# Patient Record
Sex: Male | Born: 1995 | Race: White | Hispanic: No | Marital: Single | Smoking: Never smoker
Health system: Southern US, Community
[De-identification: ages and names within clinical notes are randomized; demographics above are authoritative.]

## PROBLEM LIST (undated history)

## (undated) DIAGNOSIS — F909 Attention-deficit hyperactivity disorder, unspecified type: Secondary | ICD-10-CM

## (undated) HISTORY — DX: Attention-deficit hyperactivity disorder, unspecified type: F90.9

## (undated) HISTORY — PX: TESTICLE REMOVAL: SHX68

---
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
## 2013-01-23 ENCOUNTER — Emergency Department: Payer: Self-pay | Admitting: Emergency Medicine

## 2013-10-01 ENCOUNTER — Emergency Department: Payer: Self-pay | Admitting: Internal Medicine

## 2013-10-23 ENCOUNTER — Emergency Department: Payer: Self-pay | Admitting: Emergency Medicine

## 2014-03-12 ENCOUNTER — Emergency Department: Payer: Self-pay | Admitting: Emergency Medicine

## 2014-03-12 LAB — TROPONIN I: Troponin-I: <0.02 ng/mL

## 2014-03-12 LAB — COMPREHENSIVE METABOLIC PANEL
Albumin: 4.5 g/dL (ref 3.4–5.0)
Alkaline Phosphatase: 105 U/L (ref 46–116)
Anion Gap: 8 (ref 7–16)
BUN: 15 mg/dL (ref 7–18)
Bilirubin,Total: 1.5 mg/dL — ABNORMAL HIGH (ref 0.2–1.0)
Calcium, Total: 9.3 mg/dL (ref 8.5–10.1)
Chloride: 102 mmol/L (ref 98–107)
Co2: 27 mmol/L (ref 21–32)
Creatinine: 0.99 mg/dL (ref 0.60–1.30)
EGFR (African American): >60
EGFR (Non-African Amer.): >60
Glucose: 98 mg/dL (ref 65–99)
Osmolality: 275 (ref 275–301)
Potassium: 4.5 mmol/L (ref 3.5–5.1)
SGOT(AST): 44 U/L — ABNORMAL HIGH (ref 15–37)
SGPT (ALT): 22 U/L (ref 14–63)
Sodium: 137 mmol/L (ref 136–145)
Total Protein: 8.8 g/dL — ABNORMAL HIGH (ref 6.4–8.2)

## 2014-03-12 LAB — CBC WITH DIFFERENTIAL/PLATELET
Basophil #: 0 10*3/uL (ref 0.0–0.1)
Basophil %: 0.1 %
EOS PCT: 0.9 %
Eosinophil #: 0.1 10*3/uL (ref 0.0–0.7)
HCT: 51.1 % (ref 40.0–52.0)
HGB: 16.7 g/dL (ref 13.0–18.0)
Lymphocyte #: 0.9 10*3/uL — ABNORMAL LOW (ref 1.0–3.6)
Lymphocyte %: 6.9 %
MCH: 29.7 pg (ref 26.0–34.0)
MCHC: 32.7 g/dL (ref 32.0–36.0)
MCV: 91 fL (ref 80–100)
MONO ABS: 0.7 x10 3/mm (ref 0.2–1.0)
Monocyte %: 5.4 %
Neutrophil #: 11 10*3/uL — ABNORMAL HIGH (ref 1.4–6.5)
Neutrophil %: 86.7 %
Platelet: 197 10*3/uL (ref 150–440)
RBC: 5.62 10*6/uL (ref 4.40–5.90)
RDW: 13.3 % (ref 11.5–14.5)
WBC: 12.7 10*3/uL — AB (ref 3.8–10.6)

## 2014-03-12 LAB — LIPASE, BLOOD: Lipase: 111 U/L (ref 73–393)

## 2014-09-09 ENCOUNTER — Encounter: Payer: Self-pay | Admitting: Emergency Medicine

## 2014-09-09 DIAGNOSIS — M545 Low back pain: Secondary | ICD-10-CM | POA: Insufficient documentation

## 2014-09-09 NOTE — ED Notes (Signed)
Pt presents to ER alert and in NAD. Pt states he has been having back pain since sleeping on his couch.

## 2014-09-10 ENCOUNTER — Emergency Department
Admission: EM | Admit: 2014-09-10 | Discharge: 2014-09-10 | Disposition: A | Discharge status: Home / Self Care | Payer: Self-pay | Attending: Emergency Medicine | Admitting: Emergency Medicine

## 2014-09-10 DIAGNOSIS — M545 Low back pain, unspecified: Secondary | ICD-10-CM

## 2014-09-10 MED ORDER — CYCLOBENZAPRINE HCL 5 MG PO TABS
5.0000 mg | ORAL_TABLET | Freq: Three times a day (TID) | ORAL | Status: DC | PRN
Start: 2014-09-10 — End: 2015-01-28

## 2014-09-10 MED ORDER — CYCLOBENZAPRINE HCL 10 MG PO TABS
5.0000 mg | ORAL_TABLET | Freq: Once | ORAL | Status: AC
  Administered 2014-09-10: 5 mg via ORAL
  Filled 2014-09-10: qty 1

## 2014-09-10 NOTE — ED Provider Notes (Signed)
[facility] Emergency Department Provider Note  ____________________________________________  Time seen: 0115  I have reviewed the triage vital signs and the nursing notes.   HISTORY  Chief Complaint Back Pain   History limited by: Not Limited   HPI Darren Quinn is a 19 y.o. male who presents to the emergency department today because of back pain. He states that the pain is located in his lower mid back and lower back. It is located on the sides. It is been going on for 1 week. It has been constant. He denies any lower extremity numbness or tingling. He denies any urinary retention. He denies any bowel incontinence. Denies any fevers. Denies any IV drug use. Denies any trauma to his back.     History reviewed. No pertinent past medical history.  There are no active problems to display for this patient.   Past Surgical History  Procedure Laterality Date  . Testicle removal      No current outpatient prescriptions on file.  Allergies Hydrocodone  History reviewed. No pertinent family history.  Social History History  Substance Use Topics  . Smoking status: Never Smoker   . Smokeless tobacco: Not on file  . Alcohol Use: No    Review of Systems  Constitutional: Negative for fever. Cardiovascular: Negative for chest pain. Respiratory: Negative for shortness of breath. Gastrointestinal: Negative for abdominal pain, vomiting and diarrhea. Genitourinary: Negative for dysuria. Musculoskeletal: Positive for back pain. Skin: Negative for rash. Neurological: Negative for headaches, focal weakness or numbness.   10-point ROS otherwise negative.  ____________________________________________   PHYSICAL EXAM:  VITAL SIGNS: ED Triage Vitals  Enc Vitals Group     BP 09/09/14 2221 134/72 mmHg     Pulse Rate 09/09/14 2221 70     Resp 09/09/14 2221 20     Temp 09/09/14 2221 98.3 F (36.8 C)     Temp Source 09/09/14 2221 Oral   SpO2 09/09/14 2221 100 %     Weight 09/09/14 2221 130 lb (58.968 kg)     Height 09/09/14 2221 5\' 10"  (1.778 m)     Head Cir --      Peak Flow --      Pain Score 09/09/14 2221 9   Constitutional: Alert and oriented. Sleeping on my arrival to the exam room. Well appearing and in no distress. Eyes: Conjunctivae are normal. PERRL. Normal extraocular movements. ENT   Head: Normocephalic and atraumatic.   Nose: No congestion/rhinnorhea.   Mouth/Throat: Mucous membranes are moist.   Neck: No stridor. Hematological/Lymphatic/Immunilogical: No cervical lymphadenopathy. Cardiovascular: Normal rate, regular rhythm.  No murmurs, rubs, or gallops. Respiratory: Normal respiratory effort without tachypnea nor retractions. Breath sounds are clear and equal bilaterally. No wheezes/rales/rhonchi. Gastrointestinal: Soft and nontender. No distention.  Genitourinary: Deferred Musculoskeletal: Normal range of motion in all extremities. No joint effusions.  No lower extremity tenderness nor edema. No midline tenderness back. Neurologic:  Normal speech and language. No gross focal neurologic deficits are appreciated. Speech is normal.  Skin:  Skin is warm, dry and intact. No rash noted. Psychiatric: Mood and affect are normal. Speech and behavior are normal. Patient exhibits appropriate insight and judgment.  ____________________________________________    LABS (pertinent positives/negatives)  None  ____________________________________________   EKG  None  ____________________________________________    RADIOLOGY  None  ____________________________________________   PROCEDURES  Procedure(s) performed: None  Critical Care performed: No  ____________________________________________   INITIAL IMPRESSION / ASSESSMENT AND PLAN / ED COURSE  Pertinent labs &  imaging results that were available during my care of the patient were reviewed by me and considered in my medical  decision making (see chart for details).  Patient presents to the emergency department today with concerns for back pain. Patient does not have any red flags. On exam patient is well-appearing. Will give prescription for Flexeril. Discussed return cautions with patient.  ____________________________________________   FINAL CLINICAL IMPRESSION(S) / ED DIAGNOSES  Final diagnoses:  Bilateral low back pain without sciatica     Phineas Semen, MD 09/10/14 0131

## 2014-09-10 NOTE — Discharge Instructions (Signed)
Please seek medical attention for any high fevers, chest pain, shortness of breath, change in behavior, persistent vomiting, bloody stool or any other new or concerning symptoms. ° ° °Back Exercises °Back exercises help treat and prevent back injuries. The goal of back exercises is to increase the strength of your abdominal and back muscles and the flexibility of your back. These exercises should be started when you no longer have back pain. Back exercises include: °· Pelvic Tilt. Lie on your back with your knees bent. Tilt your pelvis until the lower part of your back is against the floor. Hold this position 5 to 10 sec and repeat 5 to 10 times. °· Knee to Chest. Pull first 1 knee up against your chest and hold for 20 to 30 seconds, repeat this with the other knee, and then both knees. This may be done with the other leg straight or bent, whichever feels better. °· Sit-Ups or Curl-Ups. Bend your knees 90 degrees. Start with tilting your pelvis, and do a partial, slow sit-up, lifting your trunk only 30 to 45 degrees off the floor. Take at least 2 to 3 seconds for each sit-up. Do not do sit-ups with your knees out straight. If partial sit-ups are difficult, simply do the above but with only tightening your abdominal muscles and holding it as directed. °· Hip-Lift. Lie on your back with your knees flexed 90 degrees. Push down with your feet and shoulders as you raise your hips a couple inches off the floor; hold for 10 seconds, repeat 5 to 10 times. °· Back arches. Lie on your stomach, propping yourself up on bent elbows. Slowly press on your hands, causing an arch in your low back. Repeat 3 to 5 times. Any initial stiffness and discomfort should lessen with repetition over time. °· Shoulder-Lifts. Lie face down with arms beside your body. Keep hips and torso pressed to floor as you slowly lift your head and shoulders off the floor. °Do not overdo your exercises, especially in the beginning. Exercises may cause you  some mild back discomfort which lasts for a few minutes; however, if the pain is more severe, or lasts for more than 15 minutes, do not continue exercises until you see your caregiver. Improvement with exercise therapy for back problems is slow.  °See your caregivers for assistance with developing a proper back exercise program. °Document Released: 03/06/2004 Document Revised: 04/21/2011 Document Reviewed: 11/28/2010 °ExitCare® Patient Information ©2015 [employer]. This information is not intended to replace advice given to you by your health care provider. Make sure you discuss any questions you have with your health care provider. ° °

## 2014-09-10 NOTE — ED Notes (Signed)
Pt. Going home with friend 

## 2015-01-28 ENCOUNTER — Encounter: Payer: Self-pay | Admitting: Gynecology

## 2015-01-28 ENCOUNTER — Ambulatory Visit
Admission: EM | Admit: 2015-01-28 | Discharge: 2015-01-28 | Disposition: A | Discharge status: Home / Self Care | Payer: Self-pay | Attending: Family Medicine | Admitting: Family Medicine

## 2015-01-28 DIAGNOSIS — M546 Pain in thoracic spine: Secondary | ICD-10-CM

## 2015-01-28 MED ORDER — NAPROXEN 500 MG PO TABS: 500.0000 mg | ORAL_TABLET | Freq: Two times a day (BID) | ORAL | Status: DC

## 2015-01-28 MED ORDER — CYCLOBENZAPRINE HCL 5 MG PO TABS: 5.0000 mg | ORAL_TABLET | Freq: Two times a day (BID) | ORAL | Status: DC | PRN

## 2015-01-28 NOTE — ED Provider Notes (Addendum)
Patient presents today with symptoms of mid back pain since yesterday. 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 He denies any radicular symptoms. He denies any acute trauma to the area or fall. He denies any fever, incontinence, weight loss.  ROS: Negative except mentioned above. Vitals as per Epic GENERAL: NAD RESP: CTA B CARD: RRR MSK: no midline tenderness, mild right generalized thoracic paravertebral tenderness, FROM, mild discomfort with flexion, -SLR, nv intact  NEURO: CN II-XII grossly intact   A/P: Right mid thoracic back strain/spasm- will treat with Naprosyn and Flexeril at this time when necessary, ice/heat when necessary, no strenuous activity for the next 2 days at work, excuse written for this, if symptoms do persist or worsen I do recommend imaging and follow-up with primary care physician.   Jolene ProvostKirtida Marshawn Ninneman, MD 01/28/15 1558  Jolene ProvostKirtida Rennie Rouch, MD 01/28/15 1600

## 2015-01-28 NOTE — ED Notes (Signed)
Patient c/o lower back pain x yesterday. Per patient work pulling tires.

## 2015-12-08 ENCOUNTER — Ambulatory Visit
Admission: EM | Admit: 2015-12-08 | Discharge: 2015-12-08 | Disposition: A | Discharge status: Home / Self Care | Payer: Self-pay | Attending: Family Medicine | Admitting: Family Medicine

## 2015-12-08 DIAGNOSIS — S0501XA Injury of conjunctiva and corneal abrasion without foreign body, right eye, initial encounter: Secondary | ICD-10-CM

## 2015-12-08 MED ORDER — GENTAMICIN SULFATE 0.3 % OP SOLN: 2.0000 [drp] | Freq: Three times a day (TID) | OPHTHALMIC | 0 refills | Status: DC

## 2015-12-08 MED ORDER — HYDROCODONE-ACETAMINOPHEN 5-325 MG PO TABS: 1.0000 | ORAL_TABLET | Freq: Three times a day (TID) | ORAL | 0 refills | Status: DC | PRN

## 2015-12-08 NOTE — ED Provider Notes (Signed)
[facility]    CSN: [id number] Arrival date & time: 12/08/15  1201     History   Chief Complaint Chief Complaint  Patient presents with  . Eye Pain    Right side at work scratch the eye irritated painful blurry onset yesterday pt doesn't use lence or glasses    HPI Darren Quinn is a 20 y.o. male.   Patient is a 20 year old white male who comes in because of irritation of his right eye. He states that he feels that something got in his right eye yesterday. He was doing some sawing at home. Since she's had this happen to him one time before as well. No known drug allergies pain medication makes him loopy as he puts it. Habits he does not smoke but he does chew. Nodisc and family medical history pertinent to today's visit. No chronic medications no previous surgeries or operations.   The history is provided by the patient. No language interpreter was used.  Eye Pain  This is a new problem. The current episode started yesterday. The problem occurs constantly. The problem has been gradually worsening. Pertinent negatives include no chest pain, no abdominal pain and no headaches. Exacerbated by: Blinking his eyes and making things worse. Nothing relieves the symptoms. Treatments tried: Irrigated his eyes at home without much improvement. The treatment provided mild relief.    History reviewed. No pertinent past medical history.  There are no active problems to display for this patient.   Past Surgical History:  Procedure Laterality Date  . TESTICLE REMOVAL         Home Medications    Prior to Admission medications   Medication Sig Start Date End Date Taking? Authorizing Provider  cyclobenzaprine (FLEXERIL) 5 MG tablet Take 1 tablet (5 mg total) by mouth 2 (two) times daily as needed for muscle spasms (can cause drowsiness). 01/28/15   Jolene ProvostKirtida Patel, MD  gentamicin (GARAMYCIN) 0.3 % ophthalmic solution Place 2 drops into the right eye 3 (three) times daily.  For the next three to five days 12/08/15   Hassan RowanEugene Vienna Folden, MD  HYDROcodone-acetaminophen [facility](NORCO) 5-325 MG tablet Take 1 tablet by mouth every 8 (eight) hours as needed for moderate pain. 12/08/15   Hassan RowanEugene Sheldon Sem, MD  naproxen (NAPROSYN) 500 MG tablet Take 1 tablet (500 mg total) by mouth 2 (two) times daily. Take with food. 01/28/15   Jolene ProvostKirtida Patel, MD    Family History No family history on file.  Social History Social History  Substance Use Topics  . Smoking status: Never Smoker  . Smokeless tobacco: Never Used  . Alcohol use No     Allergies   Hydrocodone   Review of Systems Review of Systems  Eyes: Positive for photophobia, pain and redness.  Cardiovascular: Negative for chest pain.  Gastrointestinal: Negative for abdominal pain.  Neurological: Negative for headaches.  All other systems reviewed and are negative.    Physical Exam Triage Vital Signs ED Triage Vitals  Enc Vitals Group     BP 12/08/15 1318 (!) 131/52     Pulse Rate 12/08/15 1318 63     Resp 12/08/15 1318 16     Temp 12/08/15 1318 97 F (36.1 C)     Temp Source 12/08/15 1318 Oral     SpO2 12/08/15 1318 100 %     Weight 12/08/15 1321 133 lb (60.3 kg)     Height 12/08/15 1321 5\' 11"  (1.803 m)     Head Circumference --  Peak Flow --      Pain Score 12/08/15 1323 5     Pain Loc --      Pain Edu? --      Excl. in GC? --    No data found.   Updated Vital Signs BP (!) 131/52 (BP Location: Left Arm)   Pulse 63   Temp 97 F (36.1 C) (Oral)   Resp 16   Ht 5\' 11"  (1.803 m)   Wt 133 lb (60.3 kg)   SpO2 100%   BMI 18.55 kg/m   Visual Acuity Right Eye Distance: 20/40 Left Eye Distance: 20/30 Bilateral Distance: 20/20  Right Eye Near:   Left Eye Near:    Bilateral Near:     Physical Exam  Constitutional: He appears well-developed and well-nourished.  HENT:  Head: Normocephalic and atraumatic.  Right Ear: External ear normal.  Left Ear: External ear normal.  Eyes: EOM are normal. Pupils  are equal, round, and reactive to light. Foreign body present in the right eye. Right conjunctiva is injected. Left conjunctiva is not injected.  Small foreign objects removed off the lid's that was slightly medial. Corneal abrasion present at about 5 and 7:00 as well on the cornea  Vitals reviewed.    UC Treatments / Results  Labs (all labs ordered are listed, but only abnormal results are displayed) Labs Reviewed - No data to display  EKG  EKG Interpretation None       Radiology No results found.  Procedures .Foreign Body Removal Date/Time: 12/08/2015 2:25 PM Performed by: Hassan RowanWADE, Laden Fieldhouse Authorized by: Hassan RowanWADE, Candace Begue  Consent: Verbal consent obtained. Body area: eye Location details: right eyelid Anesthesia: local infiltration  Anesthesia: Local Anesthetic: tetracaine drops  Sedation: Patient sedated: no Localization method: eyelid eversion and magnification Removal mechanism: moist cotton swab Eye examined with fluorescein. Corneal abrasion size: medium Corneal abrasion location: lateral and medial No residual rust ring present. Depth: superficial Complexity: simple Post-procedure assessment: foreign body removed Patient tolerance: Patient tolerated the procedure well with no immediate complications Comments: Patient reports improvement of the symptoms after the small object was removed from his arm.   (including critical care time)  Medications Ordered in UC Medications - No data to display   Initial Impression / Assessment and Plan / UC Course  I have reviewed the triage vital signs and the nursing notes.  Pertinent labs & imaging results that were available during my care of the patient were reviewed by me and considered in my medical decision making (see chart for details).  Clinical Course     Final Clinical Impressions(s) / UC Diagnoses   Final diagnoses:  Abrasion of right cornea, initial encounter    New Prescriptions New Prescriptions    GENTAMICIN (GARAMYCIN) 0.3 % OPHTHALMIC SOLUTION    Place 2 drops into the right eye 3 (three) times daily. For the next three to five days   HYDROCODONE-ACETAMINOPHEN (NORCO) 5-325 MG TABLET    Take 1 tablet by mouth every 8 (eight) hours as needed for moderate pain.     Hassan RowanEugene Ladora Osterberg, MD 12/08/15 [phone]

## 2015-12-09 ENCOUNTER — Telehealth: Payer: Self-pay

## 2015-12-09 NOTE — Telephone Encounter (Signed)
Spoke with patient. Patient advised to only take half of pain medication since he feels like it may be too strong for him. Patient advised to call back with any other problems or concerns. [facility]

## 2016-06-19 ENCOUNTER — Emergency Department
Admission: EM | Admit: 2016-06-19 | Discharge: 2016-06-19 | Disposition: A | Discharge status: Home / Self Care | Payer: Self-pay | Attending: Emergency Medicine | Admitting: Emergency Medicine

## 2016-06-19 DIAGNOSIS — Z79899 Other long term (current) drug therapy: Secondary | ICD-10-CM | POA: Insufficient documentation

## 2016-06-19 DIAGNOSIS — K047 Periapical abscess without sinus: Secondary | ICD-10-CM | POA: Insufficient documentation

## 2016-06-19 MED ORDER — AMOXICILLIN 500 MG PO CAPS: 1000.0000 mg | ORAL_CAPSULE | Freq: Once | ORAL | Status: DC

## 2016-06-19 MED ORDER — AMOXICILLIN 875 MG PO TABS: 875.0000 mg | ORAL_TABLET | Freq: Two times a day (BID) | ORAL | 0 refills | Status: AC

## 2016-06-19 MED ORDER — LIDOCAINE-EPINEPHRINE 2 %-1:100000 IJ SOLN: 1.7000 mL | Freq: Once | INTRAMUSCULAR | Status: DC

## 2016-06-19 MED ORDER — MAGIC MOUTHWASH W/LIDOCAINE: 5.0000 mL | Freq: Four times a day (QID) | ORAL | 0 refills | Status: DC

## 2016-06-19 MED ORDER — LIDOCAINE-EPINEPHRINE 2 %-1:100000 IJ SOLN
INTRAMUSCULAR | Status: AC
  Filled 2016-06-19: qty 1.7

## 2016-06-19 NOTE — Discharge Instructions (Signed)
OPTIONS FOR DENTAL FOLLOW UP CARE ° °[place] Department of Health and Human Services - Local Safety Net Dental Clinics °http://www.ncdhhs.gov/dph/oralhealth/services/safetynetclinics.htm °  °[facility] (336-562-3123) ° °[facility] [place] (919-933-9087) ° °[facility] ([phone] ext [phone]) ° °[facility] ([phone]) ° °[facility] ([phone]) °This clinic caters to the indigent population and is on a lottery system. °Location: °[facility], [address], [place] °Clinic Hours: °Wednesdays from 6pm - 9pm, patients seen by a lottery system. °For dates, call or go to [email or web address].edu/[facility]/patients/Dental-[facility] °Services: °Cleanings, fillings and simple extractions. °Payment Options: °DENTAL WORK IS FREE OF CHARGE. Bring proof of income or support. °Best way to get seen: °Arrive at 5:15 pm - this is a lottery, NOT first come/first serve, so arriving earlier will not increase your chances of being seen. °  °  °[facility] °[phone] °Select option 1 for emergencies °  °Location: °[facility], [address], [place] °Clinic Hours: °No walk-ins accepted - call the day before to schedule an appointment. °Check in times are 9:30 am and 1:30 pm. °Services: °Simple extractions, temporary fillings, pulpectomy/pulp debridement, uncomplicated abscess drainage. °Payment Options: °PAYMENT IS DUE AT THE TIME OF SERVICE.  Fee is usually $100-200, additional surgical procedures (e.g. abscess drainage) may be extra. °Cash, checks, Visa/MasterCard accepted.  Can file Medicaid if patient is covered for dental - patient should call case worker to check. °No discount for [facility] patients. °Best way to get seen: °MUST call the day before and get onto the schedule. Can usually be seen the next 1-2 days. No walk-ins accepted. °  °  °[facility] °[phone] °   °Location: °[facility], [address], [place] °Clinic Hours: °M, W, Th, F 8am or 1:30pm, Tues 9a or 1:30 - first come/first served. °Services: °Simple extractions, temporary fillings, uncomplicated abscess drainage.  You do not need to be an [place] resident. °Payment Options: °PAYMENT IS DUE AT THE TIME OF SERVICE. °Dental insurance, otherwise sliding scale - bring proof of income or support. °Depending on income and treatment needed, cost is usually $50-200. °Best way to get seen: °Arrive early as it is first come/first served. °  °  °[facility] °[phone] °  °Location: °[address] °Clinic Hours: °Mon-Thu 8a-5p °Services: °Most basic dental services including extractions and fillings. °Payment Options: °PAYMENT IS DUE AT THE TIME OF SERVICE. °Sliding scale, up to 50% off - bring proof if income or support. °Medicaid with dental option accepted. °Best way to get seen: °Call to schedule an appointment, can usually be seen within 2 weeks OR they will try to see walk-ins - show up at 8a or 2p (you may have to wait). °  °  °[facility] °919-245-2435 °[place] RESIDENTS ONLY °  °Location: °[facility], [address], [place], [place] [address] °Clinic Hours: By appointment only. °Monday - Thursday 8am-5pm, Friday 8am-12pm °Services: Cleanings, fillings, extractions. °Payment Options: °PAYMENT IS DUE AT THE TIME OF SERVICE. °Cash, Visa or MasterCard. Sliding scale - $30 minimum per service. °Best way to get seen: °Come in to office, complete packet and make an appointment - need proof of income °or support monies for each household member and proof of [place] residence. °Usually takes about a month to get in. °  °  °[facility] °[phone] °  °Location: °[address]   [place] °Clinic Hours: Walk-in Urgent Care Dental Services are offered Monday-Friday  mornings only. °The numbers of emergencies accepted daily is limited to the number of °providers available. °Maximum 15 - Mondays, Wednesdays & Thursdays °Maximum 10 - Tuesdays & Fridays °Services: °You do not need to be a [place] resident to be seen for a dental emergency. °Emergencies are defined as pain, swelling, abnormal bleeding, or dental trauma. Walkins will receive x-rays if needed. °NOTE: Dental cleaning is not an emergency. °Payment Options: °PAYMENT IS DUE AT THE TIME OF SERVICE. °Minimum co-pay is $40.00 for uninsured patients. °Minimum co-pay is $3.00 for Medicaid with dental coverage. °Dental Insurance is accepted and must be presented at time of visit. °Medicare does not cover dental. °Forms of payment: Cash, credit card, checks. °Best way to get seen: °If not previously registered with the clinic, walk-in dental registration begins at 7:15 am and is on a first come/first serve basis. °If previously registered with the clinic, call to make an appointment. °  °  °[facility] °[phone] °[place] RESIDENTS ONLY °  °Location: °[address], [place], [place] °Clinic Hours: °Mon-Thu 10a-2p °Services: Extractions only! °Payment Options: °FREE (donations accepted) - bring proof of income or support °Best way to get seen: °Call and schedule an appointment OR come at 8am on the 1st Monday of every month (except for holidays) when it is first come/first served. °  °  °[facility] °[phone] °  °Location: °[address], [place] °Clinic Hours: °Friday mornings °Services, Payment Options, Best way to get seen: °Call for info °

## 2016-06-19 NOTE — ED Triage Notes (Signed)
Pt in with co toothache for weeks states now feels like right side of face is swelling. Minimal swelling noted, states needs to see a dentist.

## 2016-06-19 NOTE — ED Provider Notes (Signed)
[facility] Emergency Department Provider Note  ____________________________________________  Time seen: Approximately 11:02 PM  I have reviewed the triage vital signs and the nursing notes.   HISTORY  Chief Complaint Dental Pain    HPI Darren Quinn is a 21 y.o. male who presents emergency department complaining of right upper dental pain/infection. Patient reports that he has a cavity with significant dental erosion to the second molar right upper dentition. Patient reports pain began yesterday and swelling began today. He denies fevers or chills, he denies any drainage, he denies any difficulty breathing or swallowing. Patient reports he does have a dentist, but it is a 250.co-pay for dental extraction. He does not have the amount of money at this time and is unable to see his dentist. No other complaints.   No past medical history on file.  There are no active problems to display for this patient.   Past Surgical History:  Procedure Laterality Date  . TESTICLE REMOVAL      Prior to Admission medications   Medication Sig Start Date End Date Taking? Authorizing Provider  amoxicillin (AMOXIL) 875 MG tablet Take 1 tablet (875 mg total) by mouth 2 (two) times daily. 06/19/16   Yue Glasheen, Delorise Royals, PA-C  cyclobenzaprine (FLEXERIL) 5 MG tablet Take 1 tablet (5 mg total) by mouth 2 (two) times daily as needed for muscle spasms (can cause drowsiness). 01/28/15   Jolene Provost, MD  gentamicin (GARAMYCIN) 0.3 % ophthalmic solution Place 2 drops into the right eye 3 (three) times daily. For the next three to five days 12/08/15   Hassan Rowan, MD  HYDROcodone-acetaminophen [facility]) 5-325 MG tablet Take 1 tablet by mouth every 8 (eight) hours as needed for moderate pain. 12/08/15   Hassan Rowan, MD  magic mouthwash w/lidocaine SOLN Take 5 mLs by mouth 4 (four) times daily. 06/19/16   Kavon Valenza, Delorise Royals, PA-C  naproxen (NAPROSYN) 500 MG tablet Take 1 tablet  (500 mg total) by mouth 2 (two) times daily. Take with food. 01/28/15   Jolene Provost, MD    Allergies Hydrocodone  No family history on file.  Social History Social History  Substance Use Topics  . Smoking status: Never Smoker  . Smokeless tobacco: Never Used  . Alcohol use No     Review of Systems  Constitutional: No fever/chills Eyes: No visual changes. No discharge ENT: Positive for left upper dental infection Cardiovascular: no chest pain. Respiratory: no cough. No SOB. Gastrointestinal: No abdominal pain.  No nausea, no vomiting.  Musculoskeletal: Negative for musculoskeletal pain. Skin: Negative for rash, abrasions, lacerations, ecchymosis. Neurological: Negative for headaches, focal weakness or numbness. 10-point ROS otherwise negative.  ____________________________________________   PHYSICAL EXAM:  VITAL SIGNS: ED Triage Vitals [06/19/16 2222]  Enc Vitals Group     BP 134/86     Pulse Rate (!) 51     Resp 18     Temp 98.1 F (36.7 C)     Temp Source Oral     SpO2 100 %     Weight 128 lb (58.1 kg)     Height 5\' 11"  (1.803 m)     Head Circumference      Peak Flow      Pain Score 10     Pain Loc      Pain Edu?      Excl. in GC?      Constitutional: Alert and oriented. Well appearing and in no acute distress. Eyes: Conjunctivae are normal. PERRL. EOMI. Head:  Atraumatic. ENT:      Ears:       Nose: No congestion/rhinnorhea.      Mouth/Throat: Mucous membranes are moist. Dental caries/erosion to tooth #2. Surrounding erythema and edema consistent with dental infection. No fluctuant lesion with palpation with tongue depressor. No pustular drainage noted from tooth. No loose dentition. Neck: No stridor.   Hematological/Lymphatic/Immunilogical: No cervical lymphadenopathy. Cardiovascular: Normal rate, regular rhythm. Normal S1 and S2.  Good peripheral circulation. Respiratory: Normal respiratory effort without tachypnea or retractions. Lungs CTAB.  Good air entry to the bases with no decreased or absent breath sounds. Musculoskeletal: Full range of motion to all extremities. No gross deformities appreciated. Neurologic:  Normal speech and language. No gross focal neurologic deficits are appreciated.  Skin:  Skin is warm, dry and intact. No rash noted. Psychiatric: Mood and affect are normal. Speech and behavior are normal. Patient exhibits appropriate insight and judgement.   ____________________________________________   LABS (all labs ordered are listed, but only abnormal results are displayed)  Labs Reviewed - No data to display ____________________________________________  EKG   ____________________________________________  RADIOLOGY   No results found.  ____________________________________________    PROCEDURES  Procedure(s) performed:    Procedures  Dental block is performed using a 27-gauge needle on the dental Carpuject 1.7 mL lidocaine with epi. This is injected into the inferior alveolar nerve. Patient tolerated well with no complications.  Medications  amoxicillin (AMOXIL) capsule 1,000 mg (not administered)  lidocaine-EPINEPHrine (XYLOCAINE W/EPI) 2 %-1:100000 (with pres) injection 1.7 mL (not administered)     ____________________________________________   INITIAL IMPRESSION / ASSESSMENT AND PLAN / ED COURSE  Pertinent labs & imaging results that were available during my care of the patient were reviewed by me and considered in my medical decision making (see chart for details).  Review of the [place] CSRS was performed in accordance of the [facility] prior to dispensing any controlled drugs.     Patient's diagnosis is consistent with dental infection. Patient is given first dose of antibiotics in the emergency department. Dental block is performed as described above. Patient will follow-up with dentist.. Patient is given ED precautions to return to the ED for any worsening or new  symptoms.     ____________________________________________  FINAL CLINICAL IMPRESSION(S) / ED DIAGNOSES  Final diagnoses:  Dental infection      NEW MEDICATIONS STARTED DURING THIS VISIT:  New Prescriptions   AMOXICILLIN (AMOXIL) 875 MG TABLET    Take 1 tablet (875 mg total) by mouth 2 (two) times daily.   MAGIC MOUTHWASH W/LIDOCAINE SOLN    Take 5 mLs by mouth 4 (four) times daily.        This chart was dictated using voice recognition software/Dragon. Despite best efforts to proofread, errors can occur which can change the meaning. Any change was purely unintentional.    Racheal PatchesCuthriell, Wang Gran[place] D, PA-C 06/19/16 2328    Phineas SemenGoodman, Graydon, MD 06/19/16 [phone]

## 2016-06-19 NOTE — ED Notes (Signed)

## 2018-03-22 ENCOUNTER — Encounter (HOSPITAL_COMMUNITY): Payer: Self-pay | Admitting: *Deleted

## 2018-03-22 ENCOUNTER — Other Ambulatory Visit: Payer: Self-pay

## 2018-03-22 ENCOUNTER — Emergency Department (HOSPITAL_COMMUNITY)
Admission: EM | Admit: 2018-03-22 | Discharge: 2018-03-22 | Disposition: A | Discharge status: Home / Self Care | Payer: Self-pay | Attending: Emergency Medicine | Admitting: Emergency Medicine

## 2018-03-22 DIAGNOSIS — X58XXXA Exposure to other specified factors, initial encounter: Secondary | ICD-10-CM | POA: Insufficient documentation

## 2018-03-22 DIAGNOSIS — Y999 Unspecified external cause status: Secondary | ICD-10-CM | POA: Insufficient documentation

## 2018-03-22 DIAGNOSIS — Y929 Unspecified place or not applicable: Secondary | ICD-10-CM | POA: Insufficient documentation

## 2018-03-22 DIAGNOSIS — S0501XA Injury of conjunctiva and corneal abrasion without foreign body, right eye, initial encounter: Secondary | ICD-10-CM | POA: Insufficient documentation

## 2018-03-22 DIAGNOSIS — Y939 Activity, unspecified: Secondary | ICD-10-CM | POA: Insufficient documentation

## 2018-03-22 MED ORDER — FLUORESCEIN SODIUM 1 MG OP STRP
1.0000 | ORAL_STRIP | Freq: Once | OPHTHALMIC | Status: AC
  Administered 2018-03-22: 1 via OPHTHALMIC
  Filled 2018-03-22: qty 1

## 2018-03-22 MED ORDER — TETRACAINE HCL 0.5 % OP SOLN
2.0000 [drp] | Freq: Once | OPHTHALMIC | Status: AC
  Administered 2018-03-22: 2 [drp] via OPHTHALMIC
  Filled 2018-03-22: qty 4

## 2018-03-22 MED ORDER — TOBRAMYCIN 0.3 % OP SOLN
2.0000 [drp] | OPHTHALMIC | Status: DC
  Administered 2018-03-22: 2 [drp] via OPHTHALMIC
  Filled 2018-03-22: qty 5

## 2018-03-22 NOTE — ED Triage Notes (Signed)
Pt c/o right eye pain and watering that started today, thinks that he may have gotten something in his eye but is unsure

## 2018-03-22 NOTE — ED Provider Notes (Signed)
[facility] EMERGENCY DEPARTMENT Provider Note   CSN: [id number] Arrival date & time: 03/22/18  2122     History   Chief Complaint Chief Complaint  Patient presents with  . Eye Pain    HPI Darren Quinn is a 23 y.o. male.  The history is provided by the patient. No language interpreter was used.  Eye Pain  This is a new problem. The current episode started 6 to 12 hours ago. The problem occurs constantly. The problem has been gradually worsening. Nothing aggravates the symptoms. Nothing relieves the symptoms. He has tried nothing for the symptoms.    History reviewed. No pertinent past medical history.  There are no active problems to display for this patient.   Past Surgical History:  Procedure Laterality Date  . TESTICLE REMOVAL          Home Medications    Prior to Admission medications   Medication Sig Start Date End Date Taking? Authorizing Provider  Soft Lens Products (SALINE SENSITIVE EYES) SOLN Apply 1-2 drops to eye daily as needed (for eye irritation).   Yes [provider]  amoxicillin (AMOXIL) 875 MG tablet Take 1 tablet (875 mg total) by mouth 2 (two) times daily. 06/19/16   Cuthriell, Delorise RoyalsJonathan D, PA-C    Family History No family history on file.  Social History Social History   Tobacco Use  . Smoking status: Never Smoker  . Smokeless tobacco: Never Used  Substance Use Topics  . Alcohol use: No  . Drug use: No     Allergies   Hydrocodone   Review of Systems Review of Systems  Eyes: Positive for pain.  All other systems reviewed and are negative.    Physical Exam Updated Vital Signs BP 132/89 (BP Location: Right Arm)   Pulse 69   Temp 98.1 F (36.7 C) (Oral)   Resp 16   Ht 5\' 10"  (1.778 m)   Wt 59 kg   SpO2 100%   BMI 18.65 kg/m   Physical Exam Vitals signs and nursing note reviewed.  HENT:     Head: Normocephalic.     Nose: Nose normal.  Eyes:     General: Lids are normal. Lids are everted, no foreign  bodies appreciated. Vision grossly intact. Gaze aligned appropriately.        Right eye: No foreign body.        Left eye: No foreign body.     Extraocular Movements: Extraocular movements intact.     Conjunctiva/sclera:     Right eye: Right conjunctiva is injected.     Pupils: Pupils are equal, round, and reactive to light.  Neurological:     Mental Status: He is alert.    MDM  No fluro uptake, eyelids everted and swabbed,  No foreign body,  Small corneal abrasion 9 oclock   ED Treatments / Results  Labs (all labs ordered are listed, but only abnormal results are displayed) Labs Reviewed - No data to display  EKG None  Radiology No results found.  Procedures Procedures (including critical care time)  Medications Ordered in ED Medications  tobramycin (TOBREX) 0.3 % ophthalmic solution 2 drop (2 drops Right Eye Given 03/22/18 2344)  fluorescein ophthalmic strip 1 strip (1 strip Right Eye Given 03/22/18 2308)  tetracaine (PONTOCAINE) 0.5 % ophthalmic solution 2 drop (2 drops Right Eye Given 03/22/18 2307)     Initial Impression / Assessment and Plan / ED Course  I have reviewed the triage vital signs and the  nursing notes.  Pertinent labs & imaging results that were available during my care of the patient were reviewed by me and considered in my medical decision making (see chart for details).     MDM  Pt given rx for tobrex.    Final Clinical Impressions(s) / ED Diagnoses   Final diagnoses:  Abrasion of right cornea, initial encounter    ED Discharge Orders    None    An After Visit Summary was printed and given to the patient.    Elson AreasSofia,  K, [place] JerseyPA-C 03/22/18 2350    Sabas SousBero, Michael M, MD 03/23/18 0001

## 2019-05-23 ENCOUNTER — Ambulatory Visit: Payer: Commercial Managed Care - PPO | Attending: Internal Medicine

## 2019-05-23 ENCOUNTER — Other Ambulatory Visit: Payer: Self-pay

## 2019-05-23 DIAGNOSIS — Z20822 Contact with and (suspected) exposure to covid-19: Secondary | ICD-10-CM | POA: Insufficient documentation

## 2019-05-25 LAB — NOVEL CORONAVIRUS, NAA: SARS-CoV-2, NAA: NOT DETECTED

## 2019-05-25 LAB — SARS-COV-2, NAA 2 DAY TAT

## 2019-06-16 ENCOUNTER — Emergency Department (HOSPITAL_COMMUNITY)
Admission: EM | Admit: 2019-06-16 | Discharge: 2019-06-16 | Disposition: A | Discharge status: Home / Self Care | Payer: Commercial Managed Care - PPO | Attending: Emergency Medicine | Admitting: Emergency Medicine

## 2019-06-16 ENCOUNTER — Encounter (HOSPITAL_COMMUNITY): Payer: Self-pay | Admitting: Emergency Medicine

## 2019-06-16 ENCOUNTER — Other Ambulatory Visit: Payer: Self-pay

## 2019-06-16 DIAGNOSIS — K0889 Other specified disorders of teeth and supporting structures: Secondary | ICD-10-CM | POA: Diagnosis present

## 2019-06-16 MED ORDER — NAPROXEN 250 MG PO TABS
500.0000 mg | ORAL_TABLET | Freq: Once | ORAL | Status: AC
  Administered 2019-06-16: 500 mg via ORAL
  Filled 2019-06-16: qty 2

## 2019-06-16 MED ORDER — NAPROXEN 375 MG PO TABS: 375.0000 mg | ORAL_TABLET | Freq: Two times a day (BID) | ORAL | 0 refills | Status: AC

## 2019-06-16 MED ORDER — PENICILLIN V POTASSIUM 250 MG PO TABS
500.0000 mg | ORAL_TABLET | Freq: Once | ORAL | Status: AC
  Administered 2019-06-16: 500 mg via ORAL
  Filled 2019-06-16: qty 2

## 2019-06-16 MED ORDER — PENICILLIN V POTASSIUM 500 MG PO TABS: 500.0000 mg | ORAL_TABLET | Freq: Four times a day (QID) | ORAL | 0 refills | Status: AC

## 2019-06-16 NOTE — ED Triage Notes (Signed)
Patient c/o dental pain on the right upper side since this morning.

## 2019-06-16 NOTE — Discharge Instructions (Addendum)
Take antibiotics as directed. Please take all of your antibiotics until finished.  You can take Tylenol or Ibuprofen as directed for pain. You can alternate Tylenol and Ibuprofen every 4 hours. If you take Tylenol at 1pm, then you can take Ibuprofen at 5pm. Then you can take Tylenol again at 9pm.   The exam and treatment you received today has been provided on an emergency basis only. This is not a substitute for complete medical or dental care. If your problem worsens or new symptoms (problems) appear, and you are unable to arrange prompt follow-up care with your dentist, call or return to this location. If you do not have a dentist, please follow-up with one on the list provided  CALL YOUR DENTIST OR RETURN IMMEDIATELY IF you develop a fever, rash, difficulty breathing or swallowing, neck or facial swelling, or other potentially serious concerns.   Please follow-up with one of the dental clinics provided to you below or in your paperwork. Call and tell them you were seen in the Emergency Dept and arrange for an appointment. You may have to call multiple places in order to find a place to be seen.  Dental Assistance If the dentist on-call cannot see you, please use the resources below:   Patients with Medicaid: [facility] [address], [phone], [phone]  If unable to pay, or uninsured, contact [facility] ([phone]) or [facility] ([phone] in [place], [phone] in [place]) to become qualified for the adult dental clinic  Other Low-Cost Community Dental Services: [facility]- [address], [place], [place], [address]    [phone], Ext. [phone]    2nd and 4th Thursday of the month at 6:30am    10 clients each day by appointment, can sometimes see walk-in     patients if someone does not show for an appointment [facility]- [address], [place], [place], [address]    [phone]  [facility]- [address], [place], [place], [address]    [phone]  [facility]- [phone] [facility]- [phone] [facility]- [phone]  

## 2019-06-16 NOTE — ED Provider Notes (Signed)
[facility] EMERGENCY DEPARTMENT Provider Note   CSN: [id number] Arrival date & time: 06/16/19  2120     History Chief Complaint  Patient presents with  . Dental Pain    Darren Quinn is a 24 y.o. male who presents for evaluation of right upper dental pain that began this morning.  Patient reports that he has not seen a dentist but is supposed to follow-up with one tomorrow morning.  He states that his tooth has been hurting since he woke up this morning.  He has taken Tylenol and Aleve and some topical ointment with minimal improvement.  Patient states that he has not had any fever, difficulty breathing, vomiting, swelling of his face.  He has no known drug allergies.  The history is provided by the patient.       History reviewed. No pertinent past medical history.  There are no problems to display for this patient.   Past Surgical History:  Procedure Laterality Date  . TESTICLE REMOVAL         No family history on file.  Social History   Tobacco Use  . Smoking status: Never Smoker  . Smokeless tobacco: Never Used  Substance Use Topics  . Alcohol use: No  . Drug use: No    Home Medications Prior to Admission medications   Medication Sig Start Date End Date Taking? Authorizing Provider  amoxicillin (AMOXIL) 875 MG tablet Take 1 tablet (875 mg total) by mouth 2 (two) times daily. 06/19/16   Cuthriell, Delorise Royals, PA-C  naproxen (NAPROSYN) 375 MG tablet Take 1 tablet (375 mg total) by mouth 2 (two) times daily. 06/16/19   Graciella Freer A, PA-C  penicillin v potassium (VEETID) 500 MG tablet Take 1 tablet (500 mg total) by mouth 4 (four) times daily for 7 days. 06/16/19 06/23/19  Maxwell Caul, PA-C  Soft Lens Products (SALINE SENSITIVE EYES) SOLN Apply 1-2 drops to eye daily as needed (for eye irritation).    [provider]    Allergies    Hydrocodone  Review of Systems   Review of Systems  Constitutional: Negative for fever.  HENT: Positive for  dental problem. Negative for trouble swallowing.   Respiratory: Negative for shortness of breath.   Gastrointestinal: Negative for nausea and vomiting.  All other systems reviewed and are negative.   Physical Exam Updated Vital Signs BP (!) 143/93   Pulse (!) 50   Temp 98.1 F (36.7 C) (Oral)   Resp 18   Ht 5\' 10"  (1.778 m)   Wt 59 kg   SpO2 100%   BMI 18.65 kg/m   Physical Exam Vitals and nursing note reviewed.  Constitutional:      Appearance: He is well-developed.  HENT:     Head: Normocephalic and atraumatic.     Comments: Face is symmetric in appearance without any overlying warmth, erythema, edema.    Mouth/Throat:     Comments: Tooth #2 is cracked and almost completely removed.  There is some surrounding gingival erythema.  No gingival edema.  Uvula is midline.  Airways patent, phonation is intact.  No evidence of oral angioedema. Eyes:     General: No scleral icterus.       Right eye: No discharge.        Left eye: No discharge.     Conjunctiva/sclera: Conjunctivae normal.  Pulmonary:     Effort: Pulmonary effort is normal.  Skin:    General: Skin is warm and dry.  Neurological:  Mental Status: He is alert.  Psychiatric:        Speech: Speech normal.        Behavior: Behavior normal.     ED Results / Procedures / Treatments   Labs (all labs ordered are listed, but only abnormal results are displayed) Labs Reviewed - No data to display  EKG None  Radiology No results found.  Procedures Procedures (including critical care time)  Medications Ordered in ED Medications  penicillin v potassium (VEETID) tablet 500 mg (has no administration in time range)  naproxen (NAPROSYN) tablet 500 mg (has no administration in time range)    ED Course  I have reviewed the triage vital signs and the nursing notes.  Pertinent labs & imaging results that were available during my care of the patient were reviewed by me and considered in my medical decision  making (see chart for details).    MDM Rules/Calculators/A&P                      24 yo M presents with 1 day of dental pain. No evidence of abscess requiring immediate incision and drainage. Exam not concerning for Ludwig's angina or pharyngeal abscess. Will treat with penicililn.  Patient has a appointment with dentist to follow-up with tomorrow.  Will give him outpatient dental referrals. At this time, patient exhibits no emergent life-threatening condition that require further evaluation in ED. Stable for discharge at this time. Strict return precautions discussed. Patient expresses understanding and agreement to plan.  Portions of this note were generated with L[employer]. Dictation errors may occur despite best attempts at proofreading.   Final Clinical Impression(s) / ED Diagnoses Final diagnoses:  Pain, dental    Rx / DC Orders ED Discharge Orders         Ordered    penicillin v potassium (VEETID) 500 MG tablet  4 times daily     06/16/19 2136    naproxen (NAPROSYN) 375 MG tablet  2 times daily     06/16/19 2136           Desma Mcgregor 06/16/19 2141    Fredia Sorrow, MD 06/22/19 [phone]

## 2019-07-02 ENCOUNTER — Other Ambulatory Visit: Payer: Self-pay

## 2019-07-02 ENCOUNTER — Encounter (HOSPITAL_COMMUNITY): Payer: Self-pay | Admitting: Emergency Medicine

## 2019-07-02 ENCOUNTER — Emergency Department (HOSPITAL_COMMUNITY)
Admission: EM | Admit: 2019-07-02 | Discharge: 2019-07-02 | Disposition: A | Discharge status: Home / Self Care | Payer: Commercial Managed Care - PPO | Attending: Emergency Medicine | Admitting: Emergency Medicine

## 2019-07-02 DIAGNOSIS — Z5321 Procedure and treatment not carried out due to patient leaving prior to being seen by health care provider: Secondary | ICD-10-CM | POA: Diagnosis not present

## 2019-07-02 DIAGNOSIS — K649 Unspecified hemorrhoids: Secondary | ICD-10-CM | POA: Insufficient documentation

## 2019-07-02 NOTE — ED Triage Notes (Signed)
Pt states he "thinks he had a hemorrhoid bust." pt states it is still bleeding.

## 2019-07-03 ENCOUNTER — Ambulatory Visit
Admission: EM | Admit: 2019-07-03 | Discharge: 2019-07-03 | Disposition: A | Discharge status: Home / Self Care | Payer: Commercial Managed Care - PPO

## 2019-07-03 DIAGNOSIS — K644 Residual hemorrhoidal skin tags: Secondary | ICD-10-CM | POA: Diagnosis not present

## 2019-07-03 MED ORDER — HYDROCORTISONE (PERIANAL) 2.5 % EX CREA: 1.0000 "application " | TOPICAL_CREAM | Freq: Two times a day (BID) | CUTANEOUS | 0 refills | Status: AC

## 2019-07-03 MED ORDER — BENZOCAINE 20 % RE OINT: TOPICAL_OINTMENT | RECTAL | 0 refills | Status: AC | PRN

## 2019-07-03 NOTE — ED Triage Notes (Signed)
Has hemorrhoid that has been bleeding since yesterday.

## 2019-07-03 NOTE — Discharge Instructions (Addendum)
Perform sitz water baths Eat a diet high if fiber and drink plenty of water Prescribed Anusol and benzocaine Use medication as prescribed for symptomatic relief Follow up with PCP if symptoms persists Return or go to the ER if you have any new or worsening symptoms  

## 2019-07-03 NOTE — ED Provider Notes (Signed)
[facility]    CSN: [id number] Arrival date & time: 07/03/19  0948      History   Chief Complaint Chief Complaint  Patient presents with  . Hemorrhoids    HPI Darren Quinn is a 24 y.o. male.   Who presented to the urgent care for complaint of painful external hemorrhoid that has been bleeding since yesterday.  Denies any precipitating event.  Report pain and bloody drainage when sitting down.Marland Kitchen  He describes the pain as constant and achy.  He has tried OTC medications with mild relief.  His symptoms are made worse with sitting down.  He denies similar symptoms in the past.  Denies chills, fever, nausea, vomiting, diarrhea  The history is provided by the patient. No language interpreter was used.    No past medical history on file.  There are no problems to display for this patient.   Past Surgical History:  Procedure Laterality Date  . TESTICLE REMOVAL         Home Medications    Prior to Admission medications   Medication Sig Start Date End Date Taking? Authorizing Provider  meloxicam (MOBIC) 15 MG tablet Take 15 mg by mouth daily.   Yes [provider]  meloxicam (MOBIC) 7.5 MG tablet Take 7.5 mg by mouth daily.   Yes [provider]  amoxicillin (AMOXIL) 875 MG tablet Take 1 tablet (875 mg total) by mouth 2 (two) times daily. 06/19/16   Cuthriell, Delorise Royals, PA-C  benzocaine (AMERICAINE) 20 % rectal ointment Place rectally every 3 (three) hours as needed for pain. 07/03/19   Mya Suell, Zachery Dakins, FNP  hydrocortisone (ANUSOL-HC) 2.5 % rectal cream Place 1 application rectally 2 (two) times daily. 07/03/19   Bralon Antkowiak, Zachery Dakins, FNP  naproxen (NAPROSYN) 375 MG tablet Take 1 tablet (375 mg total) by mouth 2 (two) times daily. 06/16/19   Maxwell Caul, PA-C  Soft Lens Products (SALINE SENSITIVE EYES) SOLN Apply 1-2 drops to eye daily as needed (for eye irritation).    [provider]    Family History No family history on  file.  Social History Social History   Tobacco Use  . Smoking status: Never Smoker  . Smokeless tobacco: Never Used  Substance Use Topics  . Alcohol use: No  . Drug use: No     Allergies   Hydrocodone   Review of Systems Review of Systems  Constitutional: Negative.   Respiratory: Negative.   Gastrointestinal: Positive for anal bleeding.       External hemorrhoid  Musculoskeletal: Negative.   All other systems reviewed and are negative.    Physical Exam Triage Vital Signs ED Triage Vitals  Enc Vitals Group     BP 07/03/19 1000 113/75     Pulse Rate 07/03/19 1000 72     Resp 07/03/19 1000 16     Temp 07/03/19 1000 97.9 F (36.6 C)     Temp Source 07/03/19 1000 Oral     SpO2 07/03/19 1000 98 %     Weight --      Height --      Head Circumference --      Peak Flow --      Pain Score 07/03/19 1021 1     Pain Loc --      Pain Edu? --      Excl. in GC? --    No data found.  Updated Vital Signs BP 113/75 (BP Location: Right Arm)   Pulse 72  Temp 97.9 F (36.6 C) (Oral)   Resp 16   SpO2 98%   Visual Acuity Right Eye Distance:   Left Eye Distance:   Bilateral Distance:    Right Eye Near:   Left Eye Near:    Bilateral Near:     Physical Exam Vitals and nursing note reviewed.  Constitutional:      General: He is not in acute distress.    Appearance: Normal appearance. He is normal weight. He is not ill-appearing, toxic-appearing or diaphoretic.  Cardiovascular:     Rate and Rhythm: Normal rate and regular rhythm.     Pulses: Normal pulses.     Heart sounds: No murmur. No friction rub. No gallop.   Pulmonary:     Effort: Pulmonary effort is normal. No respiratory distress.     Breath sounds: Normal breath sounds. No stridor. No wheezing, rhonchi or rales.  Chest:     Chest wall: No tenderness.  Abdominal:     General: Abdomen is flat. Bowel sounds are normal. There is no distension.     Palpations: Abdomen is soft. There is no mass.      Tenderness: There is no abdominal tenderness.     Hernia: No hernia is present.     Comments: External hemorrhoid present with clot at the anal orifice.  Small amount of blood present  Neurological:     Mental Status: He is alert.      UC Treatments / Results  Labs (all labs ordered are listed, but only abnormal results are displayed) Labs Reviewed - No data to display  EKG   Radiology No results found.  Procedures Procedures (including critical care time)  Medications Ordered in UC Medications - No data to display  Initial Impression / Assessment and Plan / UC Course  I have reviewed the triage vital signs and the nursing notes.  Pertinent labs & imaging results that were available during my care of the patient were reviewed by me and considered in my medical decision making (see chart for details).   Patient is stable hemodynamically stable for discharge.  Benzocaine and Anusol were prescribed for symptomatic relief.  She was advised to go to ED if bleeding does not stop or profusely  Bleeding.  Final Clinical Impressions(s) / UC Diagnoses   Final diagnoses:  External hemorrhoid, bleeding     Discharge Instructions     Perform sitz water baths Eat a diet high if fiber and drink plenty of water Prescribed Anusol and benzocaine Use medication as prescribed for symptomatic relief Follow up with PCP if symptoms persists Return or go to the ER if you have any new or worsening symptoms     ED Prescriptions    Medication Sig Dispense Auth. Provider   hydrocortisone (ANUSOL-HC) 2.5 % rectal cream Place 1 application rectally 2 (two) times daily. 30 g Karell Tukes, Clement Sayres S, FNP   benzocaine (AMERICAINE) 20 % rectal ointment Place rectally every 3 (three) hours as needed for pain. 28.4 g Emerson Monte, FNP     PDMP not reviewed this encounter.   Emerson Monte, [place] 07/03/19 1117

## 2019-11-01 ENCOUNTER — Other Ambulatory Visit: Payer: Commercial Managed Care - PPO

## 2019-11-01 DIAGNOSIS — Z20822 Contact with and (suspected) exposure to covid-19: Secondary | ICD-10-CM

## 2019-11-03 LAB — SARS-COV-2, NAA 2 DAY TAT

## 2019-11-03 LAB — NOVEL CORONAVIRUS, NAA: SARS-CoV-2, NAA: NOT DETECTED

## 2019-11-16 ENCOUNTER — Other Ambulatory Visit: Payer: Self-pay

## 2019-11-16 ENCOUNTER — Other Ambulatory Visit: Payer: Commercial Managed Care - PPO

## 2019-11-16 DIAGNOSIS — Z20822 Contact with and (suspected) exposure to covid-19: Secondary | ICD-10-CM

## 2019-11-17 LAB — SARS-COV-2, NAA 2 DAY TAT

## 2019-11-17 LAB — NOVEL CORONAVIRUS, NAA: SARS-CoV-2, NAA: DETECTED — AB

## 2023-11-09 ENCOUNTER — Ambulatory Visit: Admitting: Behavioral Health

## 2023-11-09 DIAGNOSIS — F411 Generalized anxiety disorder: Secondary | ICD-10-CM | POA: Diagnosis not present

## 2023-11-09 NOTE — Progress Notes (Unsigned)
 [facility] Counselor Initial Adult Exam  Name: Darren Quinn Date: 11/09/2023 MRN: [id number] DOB: [birth year] PCP: Patient, No Pcp Per  Time spent: 9 AM end time 9:59 AM, 59 minutes spent via telehealth video visit.  The patient consented to the telehealth video visit was located in his car at a job site and this therapist was in his outpatient therapist office.  Guardian/Payee: Self  Paperwork requested: No   Reason for Visit /Presenting Problem: Generalized anxiety  Mental Status Exam: Appearance:   Casual     Behavior:  Appropriate  Motor:  Normal  Speech/Language:   Clear and Coherent  Affect:  Appropriate  Mood:  normal  Thought process:  normal  Thought content:    WNL  Sensory/Perceptual disturbances:    WNL  Orientation:  oriented to person, place, time/date, situation, day of week, and month of year  Attention:  Good  Concentration:  Good  Memory:  WNL  Fund of knowledge:   Good  Insight:    Good  Judgment:   Good  Impulse Control:  Good    Risk Assessment: Danger to Self:  No Self-injurious Behavior: No Danger to Others: No Duty to Warn:no Physical Aggression / Violence:No  Access to Firearms a concern: No  Gang Involvement:No  Patient / guardian was educated about steps to take if suicide or homicide risk level increases between visits: n/a While future psychiatric events cannot be accurately predicted, the patient does not currently require acute inpatient psychiatric care and does not currently meet [place]  involuntary commitment criteria.  Substance Abuse History: Current substance abuse: No     Past Psychiatric History:   Previous psychological history is significant for anxiety and depression Outpatient Providers: Primary care physician History of Psych Hospitalization: No  Psychological Testing: n/a   Abuse History:  Victim of: No., None reported   Report needed: No. Victim of Neglect:Yes.  The patient reported that  when he was younger his mother was traveling to [place]  a lot and he grew up mostly with his paternal grandparents with his father in the home.  He said his grandparents did most of taking care of him even though his father was in the home.  He said that he did not have much in relation ship with his mother for 47 or 14 years. Perpetrator of nobne reported Witness / Exposure to Domestic Violence: None reported  Protective Services Involvement: No  Witness to [employer] Violence:  No   Family History: No family history on file.  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 History of legal issue / charges: None reported  Medical History/Surgical History: reviewed No past medical history on file.  Past Surgical History:  Procedure Laterality Date   TESTICLE  REMOVAL      Medications: Current Outpatient Medications  Medication Sig Dispense Refill   amoxicillin  (AMOXIL ) 875 MG tablet Take 1 tablet (875 mg total) by mouth 2 (two) times daily. 14 tablet 0   benzocaine  (AMERICAINE) 20 % rectal ointment Place rectally every 3 (three) hours as needed for  pain. 28.4 g 0   hydrocortisone  (ANUSOL -HC) 2.5 % rectal cream Place 1 application rectally 2 (two) times daily. 30 g 0   meloxicam (MOBIC) 15 MG tablet Take 15 mg by mouth daily.     meloxicam (MOBIC) 7.5 MG tablet Take 7.5 mg by mouth daily.     naproxen  (NAPROSYN ) 375 MG tablet Take 1 tablet (375 mg total) by mouth 2 (two) times daily. 6 tablet 0   Soft Lens Products (SALINE SENSITIVE EYES) SOLN Apply 1-2 drops to eye daily as needed (for eye irritation).     No current facility-administered medications for this visit.    Allergies  Allergen Reactions   Hydrocodone  Other (See Comments)    Dizzy    Diagnoses:  Generalized anxiety disorder  Plan of Care: I will meet with the patient every 3 weeks initially.  Patient goals will be to reduce anxiety and a formal treatment plan will be introduced after the next session.  Target date will be May 10, 2024   Lorrene CHRISTELLA Hasten, [facility]

## 2023-11-09 NOTE — Progress Notes (Unsigned)
   Darren Quinn, [facility]

## 2023-11-10 ENCOUNTER — Encounter: Payer: Self-pay | Admitting: Behavioral Health

## 2023-12-04 ENCOUNTER — Ambulatory Visit (INDEPENDENT_AMBULATORY_CARE_PROVIDER_SITE_OTHER): Admitting: Behavioral Health

## 2023-12-04 ENCOUNTER — Encounter: Payer: Self-pay | Admitting: Behavioral Health

## 2023-12-04 DIAGNOSIS — F411 Generalized anxiety disorder: Secondary | ICD-10-CM | POA: Diagnosis not present

## 2023-12-04 NOTE — Progress Notes (Signed)
   Darren Quinn, Darren Quinn

## 2023-12-04 NOTE — Progress Notes (Signed)
 [facility] Counselor/Therapist Progress Note  Patient ID: Darren Quinn, MRN: [id number],    Date: 12/04/2023  Time Spent: 8 a.m. until 8:53 AM, 53 minutes spent via a care agility telehealth visit.  The patient was in his home and this therapist was in his outpatient therapy office.  The patient consented to the telehealth video visit and is aware of its limitations.  Treatment Type: Individual Therapy  Reported Symptoms: Anxiety  Mental Status Exam: Appearance:  Casual     Behavior: Appropriate  Motor: Normal  Speech/Language:  Clear and Coherent  Affect: Appropriate  Mood: normal  Thought process: normal  Thought content:   WNL  Sensory/Perceptual disturbances:   WNL  Orientation: oriented to person, place, time/date, situation, day of week, and month of year  Attention: Good  Concentration: Good  Memory: WNL  Fund of knowledge:  Good  Insight:   Good  Judgment:  Good  Impulse Control: Good   Risk Assessment: Danger to Self:  No Self-injurious Behavior: No Danger to Others: No Duty to Warn:no Physical Aggression / Violence:No  Access to Firearms a concern: No  Gang Involvement:No   Subjective: We reviewed the contents of the initial session.  His goal primarily is to find healthier ways to deal with stressors including what is going on in the world and in his life.  There has been significant improvement in that his relationship with his wife and son is good.  He enjoys his job and is doing that well.  He is also working on finding worklife balance because he likes to help other people do things but also knows that he has responsibilities at home so we talked about how he can make priorities.  We reviewed some coping skills for anxiety including the importance of relaxation breathing and some other coping skills such as grounding exercises etc.  Interventions: Cognitive Behavioral Therapy  Diagnosis:G.A.D  Plan: I will meet with the patient once a  month virtually.  Treatment plan: We will use cognitive behavioral therapy as well as elements of dialectical behavior therapy with a goal of reducing the patient's anxiety by at least 50% with a target date of June 09, 2024.  Reducing anxiety including improving his ability to better handle anxiety symptoms and stress.  We will identify and explore causes for his anxiety and stress and look for ways to lower it.  We will also work to resolve the core conflicts that are contributing to anxiety as well as help him manage thoughts and worry some thinking contributing to feelings of anxiety.  Interventions included providing education about anxiety to help him understand his symptoms and triggers.  We will facilitate problems solution skills to help him identify and implement options for resolving stress.  We will teach coping skills for managing anxiety.  We will use cognitive behavioral therapy to identify and change anxiety provoking thoughts and behavior patterns as well as dialectical behavior therapy to teach distress tolerance and mindfulness skills.  Lorrene CHRISTELLA Hasten, [facility]

## 2024-01-01 ENCOUNTER — Encounter: Payer: Self-pay | Admitting: Behavioral Health

## 2024-01-01 ENCOUNTER — Ambulatory Visit: Admitting: Behavioral Health

## 2024-01-01 DIAGNOSIS — F411 Generalized anxiety disorder: Secondary | ICD-10-CM

## 2024-01-01 NOTE — Progress Notes (Signed)
 [facility] Counselor/Therapist Progress Note  Patient ID: Darren Quinn, MRN: [id number],    Date: 01/01/2024  Time Spent: 11 AM until 11:56 AM, 56 minutes spent via a care agility telehealth visit.  The patient was in his home and this therapist was in his outpatient therapy office.  The patient consented to the telehealth video visit and is aware of its limitations.  Treatment Type: Individual Therapy  Reported Symptoms: Anxiety  Mental Status Exam: Appearance:  Casual     Behavior: Appropriate  Motor: Normal  Speech/Language:  Clear and Coherent  Affect: Appropriate  Mood: normal  Thought process: normal  Thought content:   WNL  Sensory/Perceptual disturbances:   WNL  Orientation: oriented to person, place, time/date, situation, day of week, and month of year  Attention: Good  Concentration: Good  Memory: WNL  Fund of knowledge:  Good  Insight:   Good  Judgment:  Good  Impulse Control: Good   Risk Assessment: Danger to Self:  No Self-injurious Behavior: No Danger to Others: No Duty to Warn:no Physical Aggression / Violence:No  Access to Firearms a concern: No  Gang Involvement:No   Subjective: For the most part things are going well for the patient.  Work is going well.  He feels that he and his wife have worked hard over the past several years and improving their communication with each other and with her son.  I did introduce the 5 love languages concept briefly and encouraged him to look at that further.  His faith is important to him and in growing in his faith he recognizes there are things that seem contradictory including sometimes language that he is not proud of her anger that he wishes he managed better.  We talked about using some mindfulness techniques to keep his thoughts in the moment better recognizing language that he would prefer not to use and choosing to use other language that he feels is more reflective of who he  has.  Interventions: Cognitive Behavioral Therapy  Diagnosis:G.A.D  Plan: I will meet with the patient once a month virtually.  Treatment plan: We will use cognitive behavioral therapy as well as elements of dialectical behavior therapy with a goal of reducing the patient's anxiety by at least 50% with a target date of June 09, 2024.  Reducing anxiety including improving his ability to better handle anxiety symptoms and stress.  We will identify and explore causes for his anxiety and stress and look for ways to lower it.  We will also work to resolve the core conflicts that are contributing to anxiety as well as help him manage thoughts and worry some thinking contributing to feelings of anxiety.  Interventions included providing education about anxiety to help him understand his symptoms and triggers.  We will facilitate problems solution skills to help him identify and implement options for resolving stress.  We will teach coping skills for managing anxiety.  We will use cognitive behavioral therapy to identify and change anxiety provoking thoughts and behavior patterns as well as dialectical behavior therapy to teach distress tolerance and mindfulness skills.  Lorrene CHRISTELLA Hasten, [facility]                  Lorrene CHRISTELLA Hasten, [facility]

## 2024-01-29 ENCOUNTER — Encounter: Payer: Self-pay | Admitting: Behavioral Health

## 2024-01-29 ENCOUNTER — Ambulatory Visit: Admitting: Behavioral Health

## 2024-01-29 DIAGNOSIS — F411 Generalized anxiety disorder: Secondary | ICD-10-CM

## 2024-01-29 NOTE — Progress Notes (Signed)
 "  [facility] Counselor/Therapist Progress Note  Patient ID: Darren Quinn, MRN: [id number],    Date: 01/29/2024  Time Spent: 8:12 AM until 9 AM, 48 minutes spent via a care agility telehealth visit.  The patient was in his home and this therapist was in his outpatient therapy office.  The patient consented to the telehealth video visit and is aware of its limitations.  Treatment Type: Individual Therapy  Reported Symptoms: Anxiety  Mental Status Exam: Appearance:  Casual     Behavior: Appropriate  Motor: Normal  Speech/Language:  Clear and Coherent  Affect: Appropriate  Mood: normal  Thought process: normal  Thought content:   WNL  Sensory/Perceptual disturbances:   WNL  Orientation: oriented to person, place, time/date, situation, day of week, and month of year  Attention: Good  Concentration: Good  Memory: WNL  Fund of knowledge:  Good  Insight:   Good  Judgment:  Good  Impulse Control: Good   Risk Assessment: Danger to Self:  No Self-injurious Behavior: No Danger to Others: No Duty to Warn:no Physical Aggression / Violence:No  Access to Firearms a concern: No  Gang Involvement:No   Subjective: The patient and his family are ready for Christmas and he is ready for some time more.  His job is going well but he has been offered another job making more money with a little bit better hours but he likes what he is doing although there are some changes in the company structure that he is watching to see what happens.  He and his wife continue with behavioral therapy and says there have been some difficult things that he has been talking about and they have been processing.  He said is something it happened years ago which they were both aware of that really had not discussed and he felt the burden of not working on resolution with that so we started that in the previous session.  He said he feels better and bringing it to the table but knows there are things to  work through and we talked about how he could continue to work on that both individually and in marital therapy.  Interventions: Cognitive Behavioral Therapy  Diagnosis:G.A.D  Plan: I will meet with the patient once a month virtually.  Treatment plan: We will use cognitive behavioral therapy as well as elements of dialectical behavior therapy with a goal of reducing the patient's anxiety by at least 50% with a target date of June 09, 2024.  Reducing anxiety including improving his ability to better handle anxiety symptoms and stress.  We will identify and explore causes for his anxiety and stress and look for ways to lower it.  We will also work to resolve the core conflicts that are contributing to anxiety as well as help him manage thoughts and worry some thinking contributing to feelings of anxiety.  Interventions included providing education about anxiety to help him understand his symptoms and triggers.  We will facilitate problems solution skills to help him identify and implement options for resolving stress.  We will teach coping skills for managing anxiety.  We will use cognitive behavioral therapy to identify and change anxiety provoking thoughts and behavior patterns as well as dialectical behavior therapy to teach distress tolerance and mindfulness skills.  Lorrene CHRISTELLA Hasten, [facility]                  Lorrene CHRISTELLA Hasten, [facility]  Lorrene CHRISTELLA Hasten, [facility] "

## 2024-02-26 ENCOUNTER — Ambulatory Visit: Admitting: Behavioral Health

## 2024-02-26 ENCOUNTER — Encounter: Payer: Self-pay | Admitting: Behavioral Health

## 2024-02-26 DIAGNOSIS — F411 Generalized anxiety disorder: Secondary | ICD-10-CM

## 2024-02-26 NOTE — Progress Notes (Signed)
 "  [facility] Counselor/Therapist Progress Note  Patient ID: Darren Quinn, MRN: [id number],    Date: 02/26/2024  Time Spent: 8:01 AM until 8:34 AM, 33 minutes spent via a care agility telehealth visit.  The patient was in his home and this therapist was in his outpatient therapy office.  The patient consented to the telehealth video visit and is aware of its limitations.  Treatment Type: Individual Therapy  Reported Symptoms: Anxiety  Mental Status Exam: Appearance:  Casual     Behavior: Appropriate  Motor: Normal  Speech/Language:  Clear and Coherent  Affect: Appropriate  Mood: normal  Thought process: normal  Thought content:   WNL  Sensory/Perceptual disturbances:   WNL  Orientation: oriented to person, place, time/date, situation, day of week, and month of year  Attention: Good  Concentration: Good  Memory: WNL  Fund of knowledge:  Good  Insight:   Good  Judgment:  Good  Impulse Control: Good   Risk Assessment: Danger to Self:  No Self-injurious Behavior: No Danger to Others: No Duty to Warn:no Physical Aggression / Violence:No  Access to Firearms a concern: No  Gang Involvement:No   Subjective: The patient and his family had a good Christmas.  Work is going well.  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  He has been there 3 years.  He also anticipates at least a dollar an hour raise soon.  Wife is doing well in the new shop and she is in and he can see her gaining confidence.  Continue to go to the pastor for marital therapy and he feels that is beneficial.  He said they have some difficult things to work through but he feels they are making progress that.  He does acknowledge that he still struggles at times with irritability at home and we talked about the things that make him irritable as a way of awareness but we also looked at the mindfulness exercises to make him more aware  of what those irritability/anger triggers are and coping skills to help gradually reduce his irritability and anger.  Interventions: Cognitive Behavioral Therapy  Diagnosis:G.A.D  Plan: I will meet with the patient once a month virtually.  Treatment plan: We will use cognitive behavioral therapy as well as elements of dialectical behavior therapy with a goal of reducing the patient's anxiety by at least 50% with a target date of June 09, 2024.  Reducing anxiety including improving his ability to better handle anxiety symptoms and stress.  We will identify and explore causes for his anxiety and stress and look for ways to lower it.  We will also work to resolve the core conflicts that are contributing to anxiety as well as help him manage thoughts and worry some thinking contributing to feelings of anxiety.  Interventions included providing education about anxiety to help him understand his symptoms and triggers.  We will facilitate problems solution skills to help him identify and implement options for resolving stress.  We will teach coping skills for managing anxiety.  We will use cognitive behavioral therapy to identify and change anxiety provoking thoughts and behavior patterns as well as dialectical behavior therapy to teach distress tolerance and mindfulness skills.  Lorrene CHRISTELLA Hasten, [facility]                  Lorrene CHRISTELLA Hasten, [facility]  Lorrene CHRISTELLA Hasten, [facility]               Lorrene CHRISTELLA Hasten, [facility] "

## 2024-03-25 ENCOUNTER — Ambulatory Visit: Admitting: Behavioral Health
